# Patient Record
Sex: Male | Born: 1965 | Race: Black or African American | Hispanic: No | Marital: Married | Smoking: Never smoker
Health system: Southern US, Community
[De-identification: ages and names within clinical notes are randomized; demographics above are authoritative.]

## PROBLEM LIST (undated history)

## (undated) DIAGNOSIS — I10 Essential (primary) hypertension: Secondary | ICD-10-CM

## (undated) HISTORY — PX: HERNIA REPAIR: SHX51

---

## 2002-05-30 ENCOUNTER — Emergency Department (HOSPITAL_COMMUNITY): Admission: EM | Admit: 2002-05-30 | Discharge: 2002-05-30 | Payer: Self-pay | Admitting: Emergency Medicine

## 2009-12-26 ENCOUNTER — Emergency Department (HOSPITAL_BASED_OUTPATIENT_CLINIC_OR_DEPARTMENT_OTHER): Admission: EM | Admit: 2009-12-26 | Discharge: 2009-12-26 | Payer: Self-pay | Admitting: Emergency Medicine

## 2011-03-30 LAB — URINALYSIS, ROUTINE W REFLEX MICROSCOPIC
Bilirubin Urine: NEGATIVE
Hgb urine dipstick: NEGATIVE
Specific Gravity, Urine: 1.029 (ref 1.005–1.030)
pH: 6 (ref 5.0–8.0)

## 2011-12-07 ENCOUNTER — Encounter: Payer: Self-pay | Admitting: *Deleted

## 2011-12-07 ENCOUNTER — Emergency Department (HOSPITAL_BASED_OUTPATIENT_CLINIC_OR_DEPARTMENT_OTHER)
Admission: EM | Admit: 2011-12-07 | Discharge: 2011-12-08 | Disposition: A | Discharge status: Home / Self Care | Attending: Emergency Medicine | Admitting: Emergency Medicine

## 2011-12-07 DIAGNOSIS — N390 Urinary tract infection, site not specified: Secondary | ICD-10-CM

## 2011-12-07 DIAGNOSIS — Z79899 Other long term (current) drug therapy: Secondary | ICD-10-CM | POA: Insufficient documentation

## 2011-12-07 HISTORY — DX: Essential (primary) hypertension: I10

## 2011-12-07 NOTE — ED Notes (Signed)
Testicles swollen and painful since yesterday. No problems with urination. Swelling gets better overnight and increases with ambulation.

## 2011-12-08 ENCOUNTER — Other Ambulatory Visit (HOSPITAL_BASED_OUTPATIENT_CLINIC_OR_DEPARTMENT_OTHER): Payer: Self-pay | Admitting: Emergency Medicine

## 2011-12-08 ENCOUNTER — Other Ambulatory Visit (HOSPITAL_BASED_OUTPATIENT_CLINIC_OR_DEPARTMENT_OTHER)

## 2011-12-08 ENCOUNTER — Ambulatory Visit (INDEPENDENT_AMBULATORY_CARE_PROVIDER_SITE_OTHER)
Admission: RE | Admit: 2011-12-08 | Discharge: 2011-12-08 | Disposition: A | Discharge status: Home / Self Care | Source: Ambulatory Visit | Attending: Emergency Medicine | Admitting: Emergency Medicine

## 2011-12-08 ENCOUNTER — Ambulatory Visit (HOSPITAL_BASED_OUTPATIENT_CLINIC_OR_DEPARTMENT_OTHER)
Admission: RE | Admit: 2011-12-08 | Discharge: 2011-12-08 | Disposition: A | Discharge status: Home / Self Care | Source: Ambulatory Visit | Attending: Emergency Medicine | Admitting: Emergency Medicine

## 2011-12-08 DIAGNOSIS — R609 Edema, unspecified: Secondary | ICD-10-CM

## 2011-12-08 DIAGNOSIS — N509 Disorder of male genital organs, unspecified: Secondary | ICD-10-CM

## 2011-12-08 DIAGNOSIS — N433 Hydrocele, unspecified: Secondary | ICD-10-CM | POA: Insufficient documentation

## 2011-12-08 DIAGNOSIS — N5089 Other specified disorders of the male genital organs: Secondary | ICD-10-CM

## 2011-12-08 DIAGNOSIS — N508 Other specified disorders of male genital organs: Secondary | ICD-10-CM | POA: Insufficient documentation

## 2011-12-08 LAB — URINALYSIS, ROUTINE W REFLEX MICROSCOPIC
Glucose, UA: NEGATIVE mg/dL
Specific Gravity, Urine: 1.034 — ABNORMAL HIGH (ref 1.005–1.030)
Urobilinogen, UA: 0.2 mg/dL (ref 0.0–1.0)

## 2011-12-08 LAB — URINE MICROSCOPIC-ADD ON

## 2011-12-08 MED ORDER — CIPROFLOXACIN HCL 500 MG PO TABS
500.0000 mg | ORAL_TABLET | Freq: Once | ORAL | Status: AC
  Administered 2011-12-08: 500 mg via ORAL
  Filled 2011-12-08: qty 1

## 2011-12-08 MED ORDER — CIPROFLOXACIN HCL 500 MG PO TABS: 500.0000 mg | ORAL_TABLET | Freq: Two times a day (BID) | ORAL | Status: AC

## 2011-12-08 MED ORDER — OXYCODONE-ACETAMINOPHEN 5-325 MG PO TABS: 2.0000 | ORAL_TABLET | Freq: Four times a day (QID) | ORAL | Status: AC | PRN
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
## 2011-12-09 LAB — URINE CULTURE
Colony Count: NO GROWTH
Culture  Setup Time: 201212110646

## 2012-08-20 IMAGING — US US SCROTUM
1 series · 14 of 25 positions shown · non-contrast
Comparison: None

CLINICAL DATA: Scrotal pain and swelling.

SCROTAL ULTRASOUND
DOPPLER ULTRASOUND OF THE TESTICLES
TECHNIQUE: Complete ultrasound examination of the testicles,
epididymis, and other scrotal structures was performed.  Color and
spectral Doppler ultrasound were also utilized to evaluate blood
flow to the testicles.

[Series 1: us scrotum · 0.09mm/px · 14 of 39 slices shown]
[im 1/39]
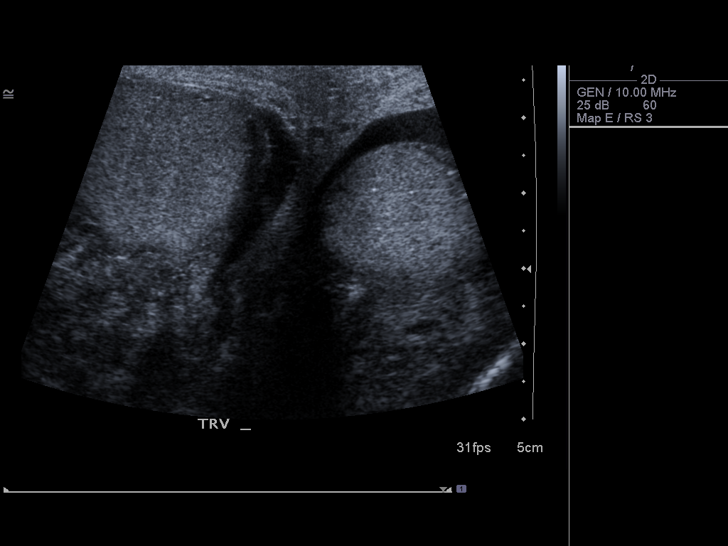
[im 4/39]
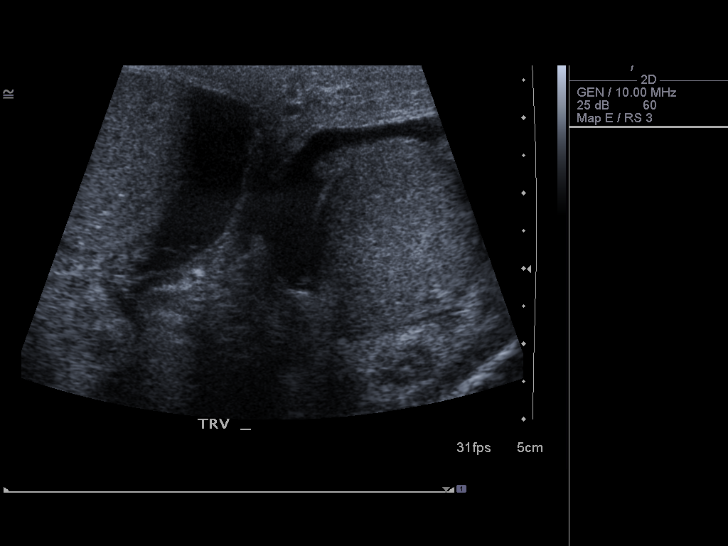
[im 7/39]
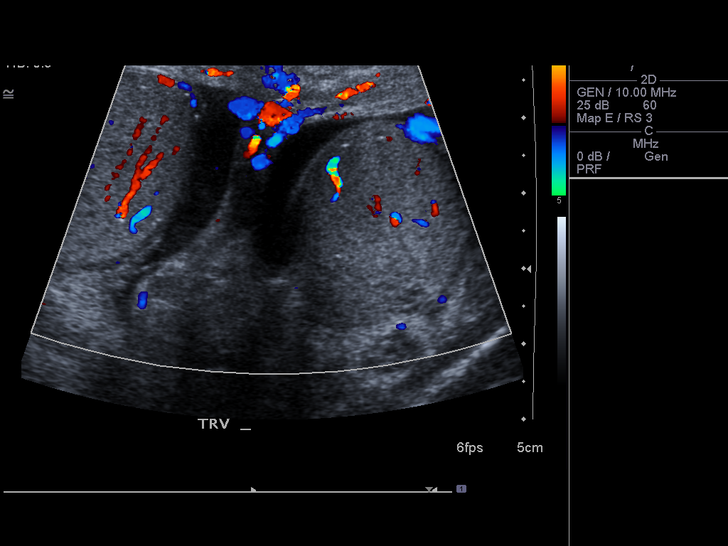
[im 10/39]
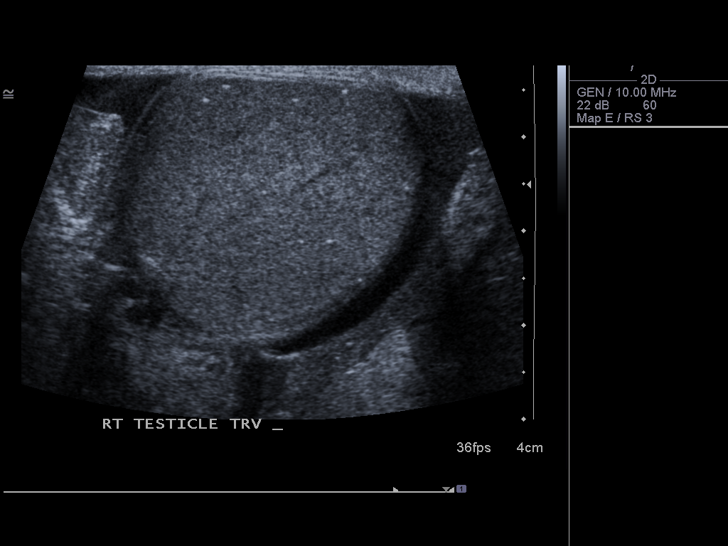
[im 13/39]
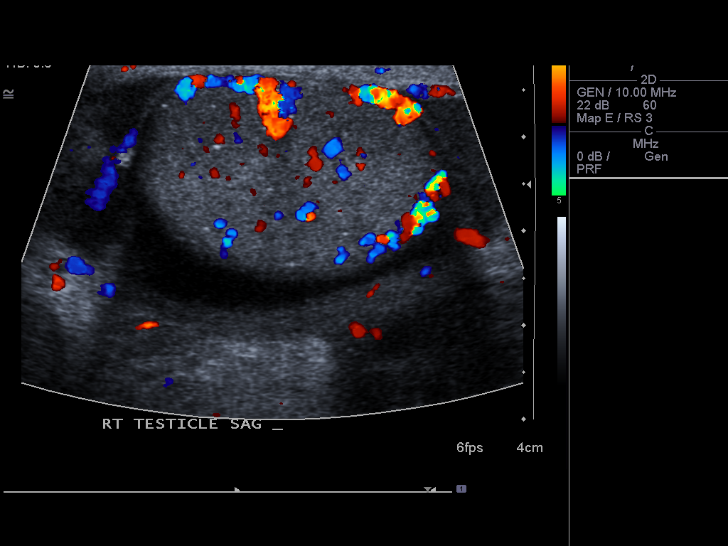
[im 15/39]
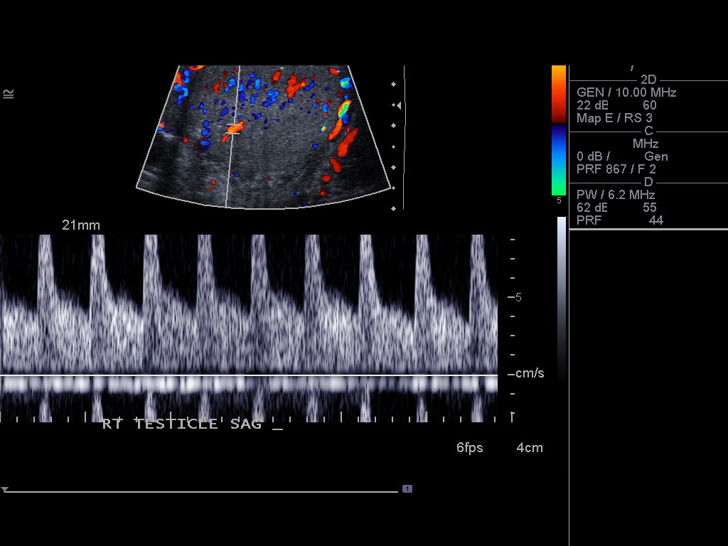
[im 18/39]
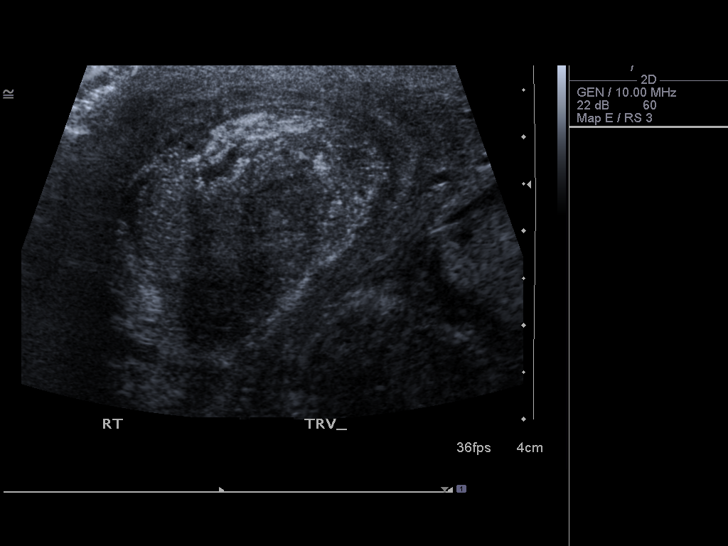
[im 21/39]
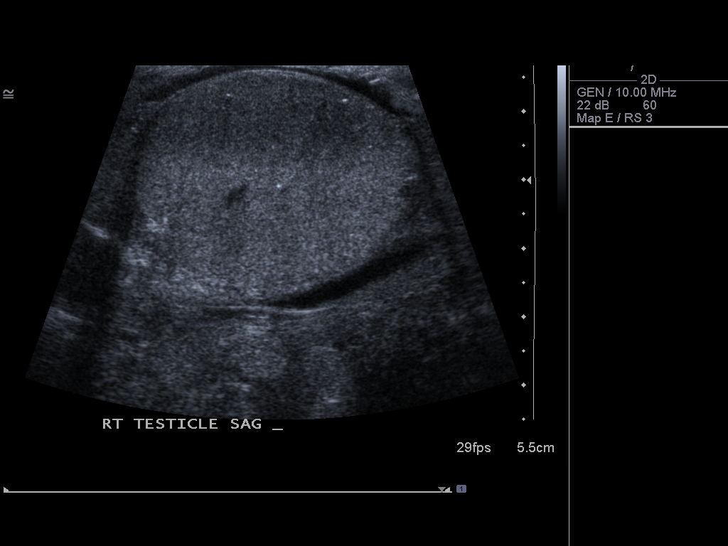
[im 24/39]
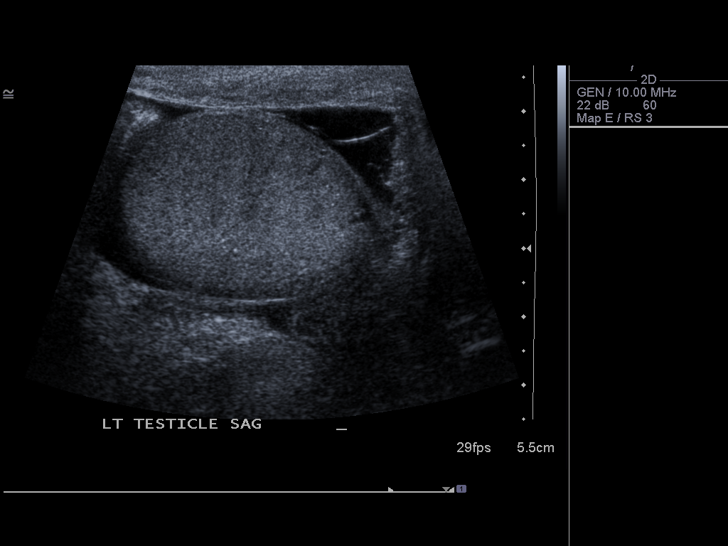
[im 26/39]
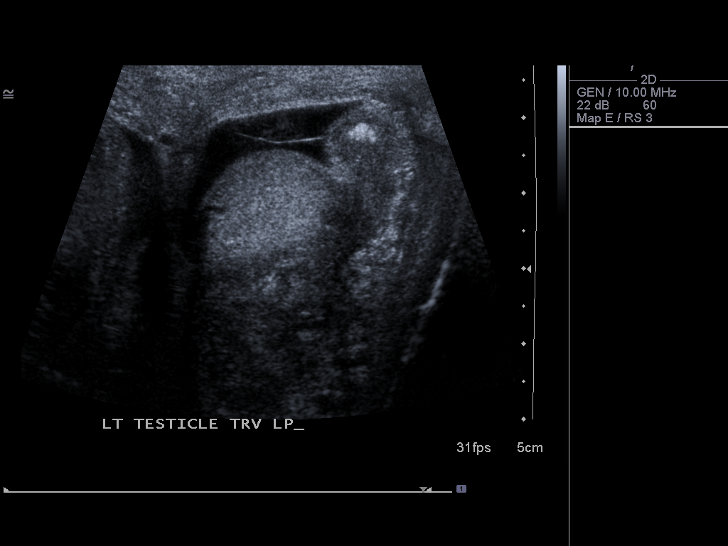
[im 29/39]
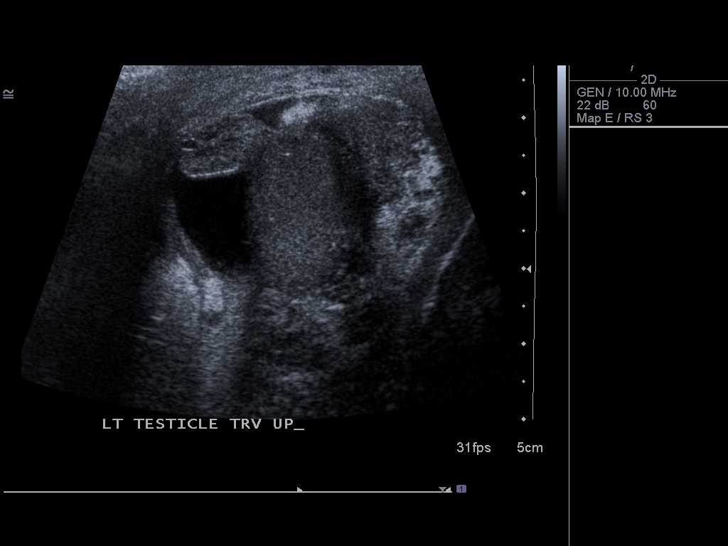
[im 32/39]
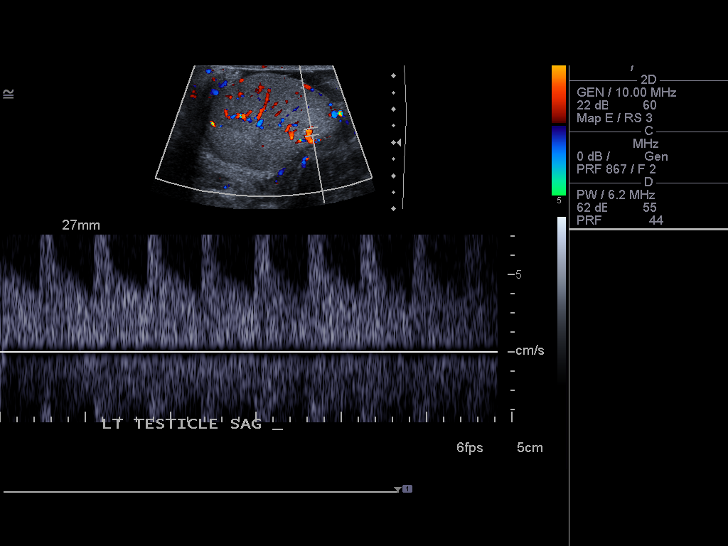
[im 35/39]
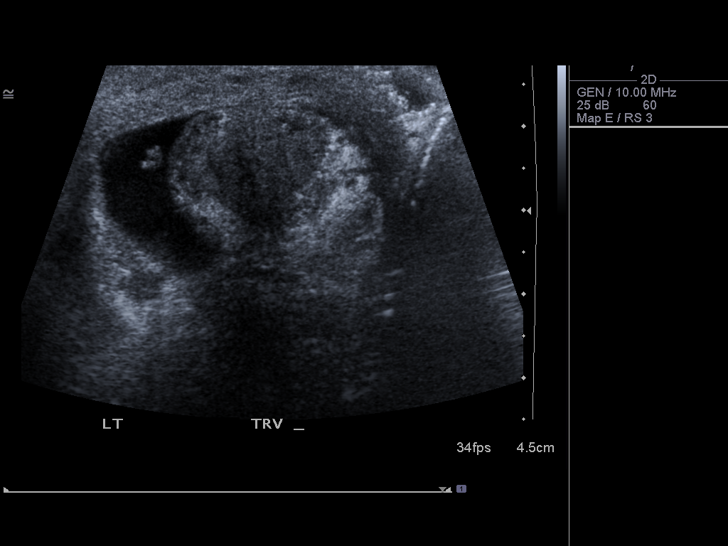
[im 39/39]
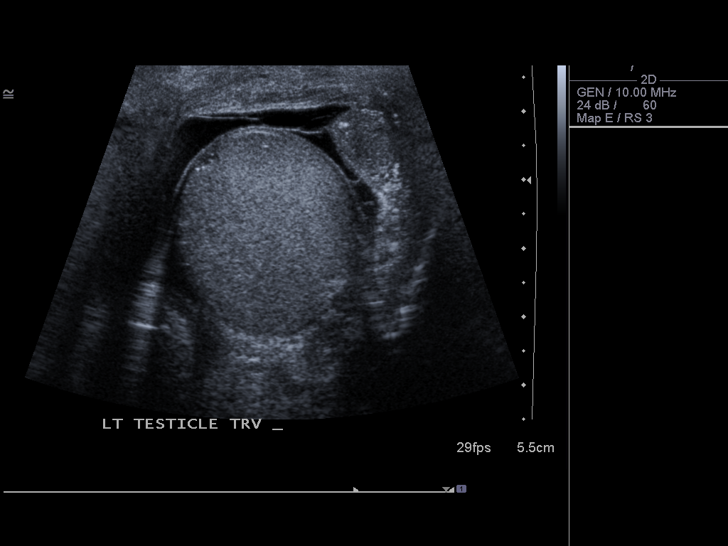

[14 of 25 positions shown; findings below may reference images not displayed]

FINDINGS: Right testis:  Measures 4.2 x 2.9 x 3.1 cm.  Fairly homogeneous
echogenicity except for testicular microlithiasis.  No mass lesion.
Patent intra testicular blood flow with slight hyperemia.

Left testis:  Measures 3.9 x 2.7 x 2.7 cm.  Homogeneous
echogenicity except for testicular microlithiasis.  Patent
intratesticular blood flow.  Slight hyperemia.

Right epididymis:  Markedly enlarged and heterogeneous with
hypervascularity.

Left epididymis:  Markedly enlarged and heterogeneous with
hypervascularity.

Hydocele:  Small bilateral hydroceles.

Varicocele:  None

Pulsed Doppler interrogation of both testes demonstrates low
resistance flow bilaterally.
IMPRESSION: 1.  Findings consistent with epididymo-orchitis bilaterally.
2.  Testicular microlithiasis.  Recommend sonographic surveillance
with repeat study in 1 year.

## 2013-11-24 ENCOUNTER — Emergency Department (HOSPITAL_BASED_OUTPATIENT_CLINIC_OR_DEPARTMENT_OTHER)
Admission: EM | Admit: 2013-11-24 | Discharge: 2013-11-25 | Disposition: A | Discharge status: Skilled Nursing Facility | Attending: Emergency Medicine | Admitting: Emergency Medicine

## 2013-11-24 ENCOUNTER — Encounter (HOSPITAL_BASED_OUTPATIENT_CLINIC_OR_DEPARTMENT_OTHER): Payer: Self-pay | Admitting: Emergency Medicine

## 2013-11-24 DIAGNOSIS — N50812 Left testicular pain: Secondary | ICD-10-CM

## 2013-11-24 DIAGNOSIS — N509 Disorder of male genital organs, unspecified: Secondary | ICD-10-CM | POA: Insufficient documentation

## 2013-11-24 DIAGNOSIS — Z7982 Long term (current) use of aspirin: Secondary | ICD-10-CM | POA: Insufficient documentation

## 2013-11-24 DIAGNOSIS — Z79899 Other long term (current) drug therapy: Secondary | ICD-10-CM | POA: Insufficient documentation

## 2013-11-24 DIAGNOSIS — I10 Essential (primary) hypertension: Secondary | ICD-10-CM | POA: Insufficient documentation

## 2013-11-24 LAB — URINALYSIS, ROUTINE W REFLEX MICROSCOPIC
Glucose, UA: NEGATIVE mg/dL
Ketones, ur: 15 mg/dL — AB
Nitrite: NEGATIVE
Protein, ur: 100 mg/dL — AB
Urobilinogen, UA: 0.2 mg/dL (ref 0.0–1.0)

## 2013-11-24 LAB — URINE MICROSCOPIC-ADD ON

## 2013-11-24 NOTE — ED Notes (Signed)
Called [place] supervisor at 2150 placed called to Dr. Stanton Kidney Copper per request by Dr. Jodelle Green, called for [facility] canceled pt refuses to come with [facility].  Lenita Peregrina NS

## 2013-11-24 NOTE — ED Provider Notes (Addendum)
CSN: [id number]     Arrival date & time 11/24/13  2219 History   First MD Initiated Contact with Patient 11/24/13 2305     Chief Complaint  Patient presents with  . Testicle Pain   (Consider location/radiation/quality/duration/timing/severity/associated sxs/prior Treatment) HPI Comments: Sudden onset L testicular pain since 3pm today. Feels L testicle is positioned higher than normal.   Patient is a 47 y.o. male presenting with testicular pain. The history is provided by the patient.  Testicle Pain This is a new problem. The current episode started 6 to 12 hours ago. The problem occurs constantly. The problem has been gradually worsening. Pertinent negatives include no chest pain, no abdominal pain, no headaches and no shortness of breath. Nothing aggravates the symptoms. Nothing relieves the symptoms. He has tried nothing for the symptoms. The treatment provided no relief.    Past Medical History  Diagnosis Date  . Hypertension    Past Surgical History  Procedure Laterality Date  . Hernia repair     No family history on file. History  Substance Use Topics  . Smoking status: Never Smoker   . Smokeless tobacco: Not on file  . Alcohol Use: Yes    Review of Systems  Constitutional: Negative for fever, activity change, appetite change and fatigue.  HENT: Negative for congestion, facial swelling, rhinorrhea and trouble swallowing.   Eyes: Negative for photophobia and pain.  Respiratory: Negative for cough, chest tightness and shortness of breath.   Cardiovascular: Negative for chest pain and leg swelling.  Gastrointestinal: Negative for nausea, vomiting, abdominal pain, diarrhea and constipation.  Endocrine: Negative for polydipsia and polyuria.  Genitourinary: Positive for testicular pain. Negative for dysuria, urgency, decreased urine volume and difficulty urinating.  Musculoskeletal: Negative for back pain and gait problem.  Skin: Negative for color change, rash and wound.   Allergic/Immunologic: Negative for immunocompromised state.  Neurological: Negative for dizziness, facial asymmetry, speech difficulty, weakness, numbness and headaches.  Psychiatric/Behavioral: Negative for confusion, decreased concentration and agitation.    Allergies  Review of patient's allergies indicates no known allergies.  Home Medications   Current Outpatient Rx  Name  Route  Sig  Dispense  Refill  . UNKNOWN TO PATIENT      States he is on BP med x2 and cholesterol med x 1 but none in the epic list         . aspirin EC 81 MG tablet   Oral   Take 81 mg by mouth daily.           Marland Kitchen atorvastatin (LIPITOR) 40 MG tablet   Oral   Take 40 mg by mouth daily.           . chlorthalidone (HYGROTON) 25 MG tablet   Oral   Take 25 mg by mouth daily.           Marland Kitchen lisinopril (PRINIVIL,ZESTRIL) 40 MG tablet   Oral   Take 40 mg by mouth daily.           Marland Kitchen NIFEdipine (PROCARDIA-XL/ADALAT CC) 60 MG 24 hr tablet   Oral   Take 60 mg by mouth daily.           . Oxycodone-Acetaminophen (PERCOCET PO)   Oral   Take 0.5 tablets by mouth once.            BP 163/105  Pulse 80  Temp(Src) 98.5 F (36.9 C) (Oral)  Resp 20  Ht 5\' 11"  (1.803 m)  Wt 270 lb (122.471 kg)  BMI 37.67 kg/m2  SpO2 100% Physical Exam  Constitutional: He is oriented to person, place, and time. He appears well-developed and well-nourished. No distress.  HENT:  Head: Normocephalic and atraumatic.  Mouth/Throat: No oropharyngeal exudate.  Eyes: Pupils are equal, round, and reactive to light.  Neck: Normal range of motion. Neck supple.  Cardiovascular: Normal rate, regular rhythm and normal heart sounds.  Exam reveals no gallop and no friction rub.   No murmur heard. Pulmonary/Chest: Effort normal and breath sounds normal. No respiratory distress. He has no wheezes. He has no rales.  Abdominal: Soft. Bowel sounds are normal. He exhibits no distension and no mass. There is no tenderness. There is  no rebound and no guarding. Hernia confirmed negative in the right inguinal area and confirmed negative in the left inguinal area.  Genitourinary: Right testis shows no swelling and no tenderness. Cremasteric reflex is not absent on the right side. Left testis shows tenderness. Left testis shows no mass and no swelling. Cremasteric reflex is not absent on the left side. Circumcised.  Musculoskeletal: Normal range of motion. He exhibits no edema and no tenderness.  Neurological: He is alert and oriented to person, place, and time.  Skin: Skin is warm and dry.  Psychiatric: He has a normal mood and affect.    ED Course  Procedures (including critical care time) Labs Review Labs Reviewed  URINALYSIS, ROUTINE W REFLEX MICROSCOPIC - Abnormal; Notable for the following:    Color, Urine AMBER (*)    APPearance TURBID (*)    Hgb urine dipstick LARGE (*)    Ketones, ur 15 (*)    Protein, ur 100 (*)    Leukocytes, UA LARGE (*)    All other components within normal limits  URINE MICROSCOPIC-ADD ON - Abnormal; Notable for the following:    Bacteria, UA MANY (*)    All other components within normal limits  URINE CULTURE   Imaging Review No results found.  EKG Interpretation   None       MDM   1. Testicular pain, left    Pt is a 47 y.o. male with Pmhx as above who presents with sudden onset L testicular pain 9 hrs ago.  No fever, n/v, d/a, dysuria.  Hx of similar several years ago. He states he feels L testicle is situated higher than nml. On PE, VSS, pt in NAD.  +nml lie, no scrotal swelling, nml cremasteric reflex, but L testicle does have higher position than left.  No hernia.  Have spoken to Dr. Excell Seltzer at [facility] hospital who will accept the pt for [place].  Would like to send pt by [facility], but pt refusing. He will go straight to ED by private vehicle.  I have stressed the importance of emergent [place] to r/o torsion as he could have ischemia and loss of function of L testicle.  Ddx includes  testicular torsion, epididymitis, appendiceal torsion.     12:09 AM Pt states he will not go to [facility].  He will sign out AMA.  He understands consequences of testicular torsion, which could lead to loss of testicular function, which could eventually lead to sepsis, death.     Shanna Cisco, MD 11/25/13 0002  Shanna Cisco, MD 11/25/13 0010

## 2013-11-24 NOTE — ED Notes (Signed)
Left testicle pain x today

## 2013-11-25 NOTE — ED Notes (Signed)
Pt refused transport and is refusing to go to [facility] ed for [place] , AMA signed

## 2013-11-25 NOTE — ED Notes (Signed)
MD at bedside. 

## 2013-11-26 LAB — URINE CULTURE: Culture: NO GROWTH

## 2017-03-03 ENCOUNTER — Emergency Department (HOSPITAL_BASED_OUTPATIENT_CLINIC_OR_DEPARTMENT_OTHER)
Admission: EM | Admit: 2017-03-03 | Discharge: 2017-03-03 | Disposition: A | Discharge status: Home / Self Care | Attending: Emergency Medicine | Admitting: Emergency Medicine

## 2017-03-03 ENCOUNTER — Encounter (HOSPITAL_BASED_OUTPATIENT_CLINIC_OR_DEPARTMENT_OTHER): Payer: Self-pay

## 2017-03-03 DIAGNOSIS — Z7982 Long term (current) use of aspirin: Secondary | ICD-10-CM | POA: Diagnosis not present

## 2017-03-03 DIAGNOSIS — I10 Essential (primary) hypertension: Secondary | ICD-10-CM | POA: Diagnosis not present

## 2017-03-03 DIAGNOSIS — J069 Acute upper respiratory infection, unspecified: Secondary | ICD-10-CM | POA: Diagnosis not present

## 2017-03-03 DIAGNOSIS — Z79899 Other long term (current) drug therapy: Secondary | ICD-10-CM | POA: Diagnosis not present

## 2017-03-03 DIAGNOSIS — R05 Cough: Secondary | ICD-10-CM | POA: Diagnosis present

## 2017-03-03 NOTE — ED Notes (Signed)
Pt states has been drinking Gatorade and eating some without difficulty.

## 2017-03-03 NOTE — ED Triage Notes (Signed)
Flu like sx x 3 days-NAD-steady gait

## 2017-03-03 NOTE — ED Provider Notes (Signed)
[facility]   CSN: [id number] Arrival date & time: 03/03/17  1123     History   Chief Complaint Chief Complaint  Patient presents with  . Cough    HPI Darrell Booker is a 51 y.o. male.  Patient is a 51 year old male with history of hypertension. He presents for evaluation of congestion, cough, fever, and chills for the past 3-4 days. He denies any chest pain or difficulty breathing. He denies any sore throat. He believes his wife is coming down with a similar illness. Nonsmoker.   The history is provided by the patient.  URI   This is a new problem. Episode onset: 3-4 days ago. The problem has been gradually worsening. The maximum temperature recorded prior to his arrival was 100 to 100.9 F. Associated symptoms include sore throat and cough. Pertinent negatives include no chest pain, no diarrhea, no nausea and no vomiting. He has tried nothing for the symptoms. The treatment provided no relief.    Past Medical History:  Diagnosis Date  . Hypertension     There are no active problems to display for this patient.   Past Surgical History:  Procedure Laterality Date  . HERNIA REPAIR         Home Medications    Prior to Admission medications   Medication Sig Start Date End Date Taking? Authorizing Provider  AMLODIPINE BESYLATE PO Take by mouth.   Yes Historical Provider, MD  tamsulosin (FLOMAX) 0.4 MG CAPS capsule Take 0.4 mg by mouth.   Yes Historical Provider, MD  aspirin EC 81 MG tablet Take 81 mg by mouth daily.      Historical Provider, MD  atorvastatin (LIPITOR) 40 MG tablet Take 40 mg by mouth daily.      Historical Provider, MD    Family History No family history on file.  Social History Social History  Substance Use Topics  . Smoking status: Never Smoker  . Smokeless tobacco: Never Used  . Alcohol use Yes     Comment: occ     Allergies   Patient has no known allergies.   Review of Systems Review of Systems  HENT:  Positive for sore throat.   Respiratory: Positive for cough.   Cardiovascular: Negative for chest pain.  Gastrointestinal: Negative for diarrhea, nausea and vomiting.  All other systems reviewed and are negative.    Physical Exam Updated Vital Signs BP 146/98 (BP Location: Left Arm)   Pulse 78   Temp 98.8 F (37.1 C) (Oral)   Resp 18   Ht 5\' 11"  (1.803 m)   Wt 267 lb (121.1 kg)   SpO2 100%   BMI 37.24 kg/m   Physical Exam  Constitutional: He is oriented to person, place, and time. He appears well-developed and well-nourished. No distress.  HENT:  Head: Normocephalic and atraumatic.  Mouth/Throat: Oropharynx is clear and moist. No oropharyngeal exudate.  TM's clear bilaterally.  Neck: Normal range of motion. Neck supple.  Cardiovascular: Normal rate and regular rhythm.  Exam reveals no friction rub.   No murmur heard. Pulmonary/Chest: Effort normal and breath sounds normal. No respiratory distress. He has no wheezes. He has no rales.  Abdominal: Soft. Bowel sounds are normal. He exhibits no distension. There is no tenderness.  Musculoskeletal: Normal range of motion. He exhibits no edema.  Lymphadenopathy:    He has no cervical adenopathy.  Neurological: He is alert and oriented to person, place, and time. Coordination normal.  Skin: Skin is warm and dry.  He is not diaphoretic.  Nursing note and vitals reviewed.    ED Treatments / Results  Labs (all labs ordered are listed, but only abnormal results are displayed) Labs Reviewed - No data to display  EKG  EKG Interpretation None       Radiology No results found.  Procedures Procedures (including critical care time)  Medications Ordered in ED Medications - No data to display   Initial Impression / Assessment and Plan / ED Course  I have reviewed the triage vital signs and the nursing notes.  Pertinent labs & imaging results that were available during my care of the patient were reviewed by me and  considered in my medical decision making (see chart for details).  Symptoms most likely viral in nature. Will recommend over-the-counter medications, plenty of fluids, and follow-up as needed.  Final Clinical Impressions(s) / ED Diagnoses   Final diagnoses:  None    New Prescriptions New Prescriptions   No medications on file     Geoffery Lyons, MD 03/03/17 1224

## 2017-03-03 NOTE — ED Notes (Signed)
Presents with not feeling well for the past 2 days, states has had poor appetite, using theraflu, has body aches, feels stiffness, chills, cough. Has intermittent nausea, denies vomiting, has had some diarrhea at times at this time.

## 2017-03-03 NOTE — Discharge Instructions (Signed)
Over-the-counter medications as needed for symptom relief.  Return to the emergency department if you develop chest pain, difficulty breathing, or other new and concerning symptoms.

## 2017-05-12 ENCOUNTER — Telehealth: Payer: Self-pay

## 2017-05-12 NOTE — Telephone Encounter (Signed)
SENT NOTES TO [facility] BY [facility]

## 2017-05-17 ENCOUNTER — Ambulatory Visit
Admission: RE | Admit: 2017-05-17 | Discharge: 2017-05-17 | Disposition: A | Discharge status: Home / Self Care | Source: Ambulatory Visit | Attending: Cardiology | Admitting: Cardiology

## 2017-05-17 ENCOUNTER — Other Ambulatory Visit: Payer: Self-pay | Admitting: Cardiology

## 2017-05-17 DIAGNOSIS — R079 Chest pain, unspecified: Secondary | ICD-10-CM

## 2017-06-09 ENCOUNTER — Telehealth: Payer: Self-pay | Admitting: Cardiology

## 2017-06-09 NOTE — Telephone Encounter (Signed)
[facility] on 06/23/17 with Dr Jens Somrenshaw.  Records put with Dr Ludwig Clarksrenshaw's schedule for 06/23/17. lp

## 2017-06-17 NOTE — Progress Notes (Deleted)
    Referring- Reason for referral-  HPI:   Current Outpatient Prescriptions  Medication Sig Dispense Refill  . AMLODIPINE BESYLATE PO Take by mouth.    Marland Kitchen. aspirin EC 81 MG tablet Take 81 mg by mouth daily.      Marland Kitchen. atorvastatin (LIPITOR) 40 MG tablet Take 40 mg by mouth daily.      . tamsulosin (FLOMAX) 0.4 MG CAPS capsule Take 0.4 mg by mouth.     No current facility-administered medications for this visit.     No Known Allergies  Past Medical History:  Diagnosis Date  . Hypertension     Past Surgical History:  Procedure Laterality Date  . HERNIA REPAIR      Social History   Social History  . Marital status: Married    Spouse name: N/A  . Number of children: N/A  . Years of education: N/A   Occupational History  . Not on file.   Social History Main Topics  . Smoking status: Never Smoker  . Smokeless tobacco: Never Used  . Alcohol use Yes     Comment: occ  . Drug use: No  . Sexual activity: Not on file   Other Topics Concern  . Not on file   Social History Narrative  . No narrative on file    No family history on file.  ROS: no fevers or chills, productive cough, hemoptysis, dysphasia, odynophagia, melena, hematochezia, dysuria, hematuria, rash, seizure activity, orthopnea, PND, pedal edema, claudication. Remaining systems are negative.  Physical Exam:   There were no vitals taken for this visit.  General:  Well developed/well nourished in NAD Skin warm/dry Patient not depressed No peripheral clubbing Back-normal HEENT-normal/normal eyelids Neck supple/normal carotid upstroke bilaterally; no bruits; no JVD; no thyromegaly chest - CTA/ normal expansion CV - RRR/normal S1 and S2; no murmurs, rubs or gallops;  PMI nondisplaced Abdomen -NT/ND, no HSM, no mass, + bowel sounds, no bruit 2+ femoral pulses, no bruits Ext-no edema, chords, 2+ DP Neuro-grossly nonfocal  ECG - personally reviewed  A/P  1  Olga MillersBrian Crenshaw, MD

## 2017-06-23 ENCOUNTER — Ambulatory Visit: Admitting: Cardiology

## 2018-01-28 IMAGING — DX DG CHEST 2V
2 series · 2 of 2 positions shown · non-contrast
Comparison: None.

CLINICAL DATA: Chest pain for several months.

EXAM:
CHEST  2 VIEW

[dg chest 2 view (1 of 2)]
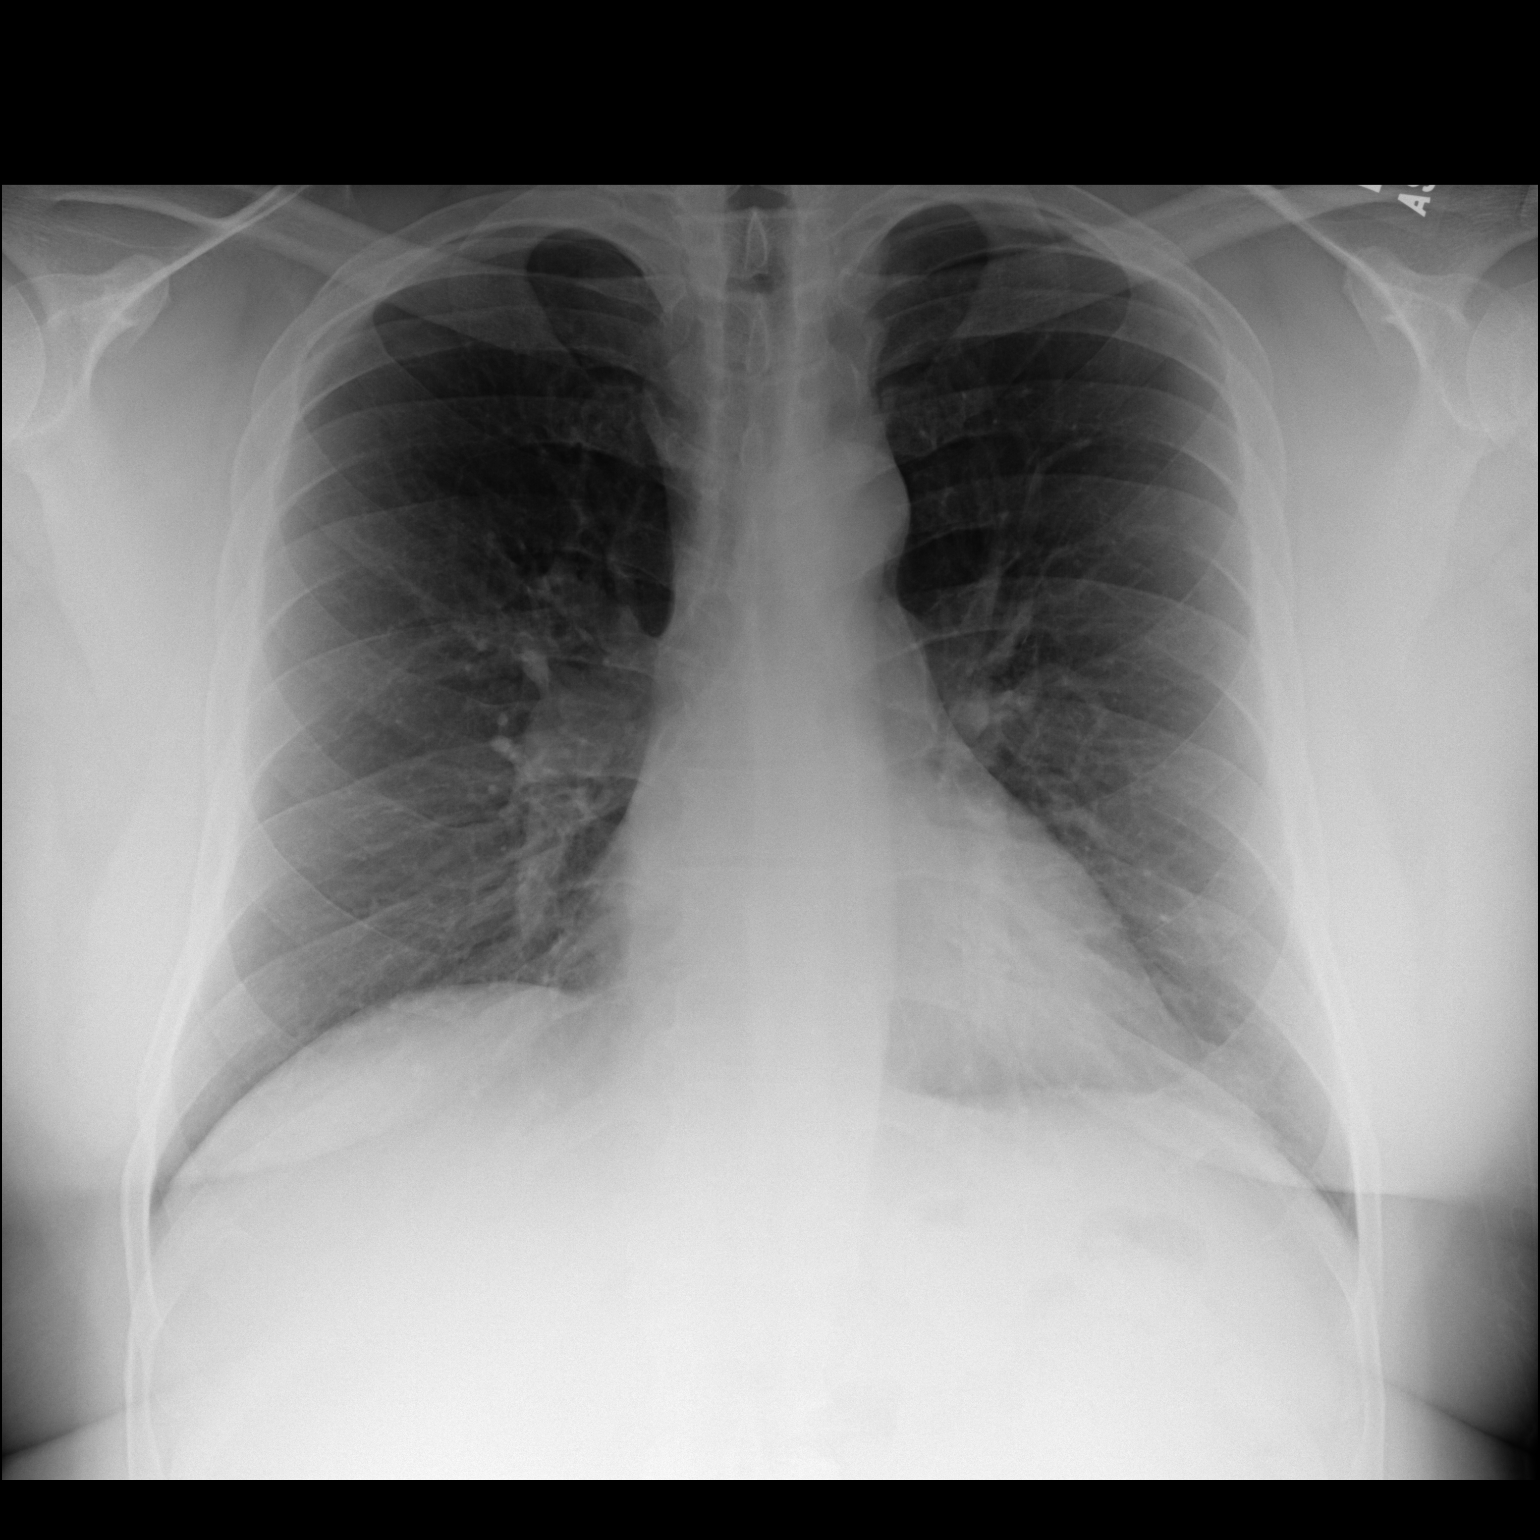

[dg chest 2 view (2 of 2)]
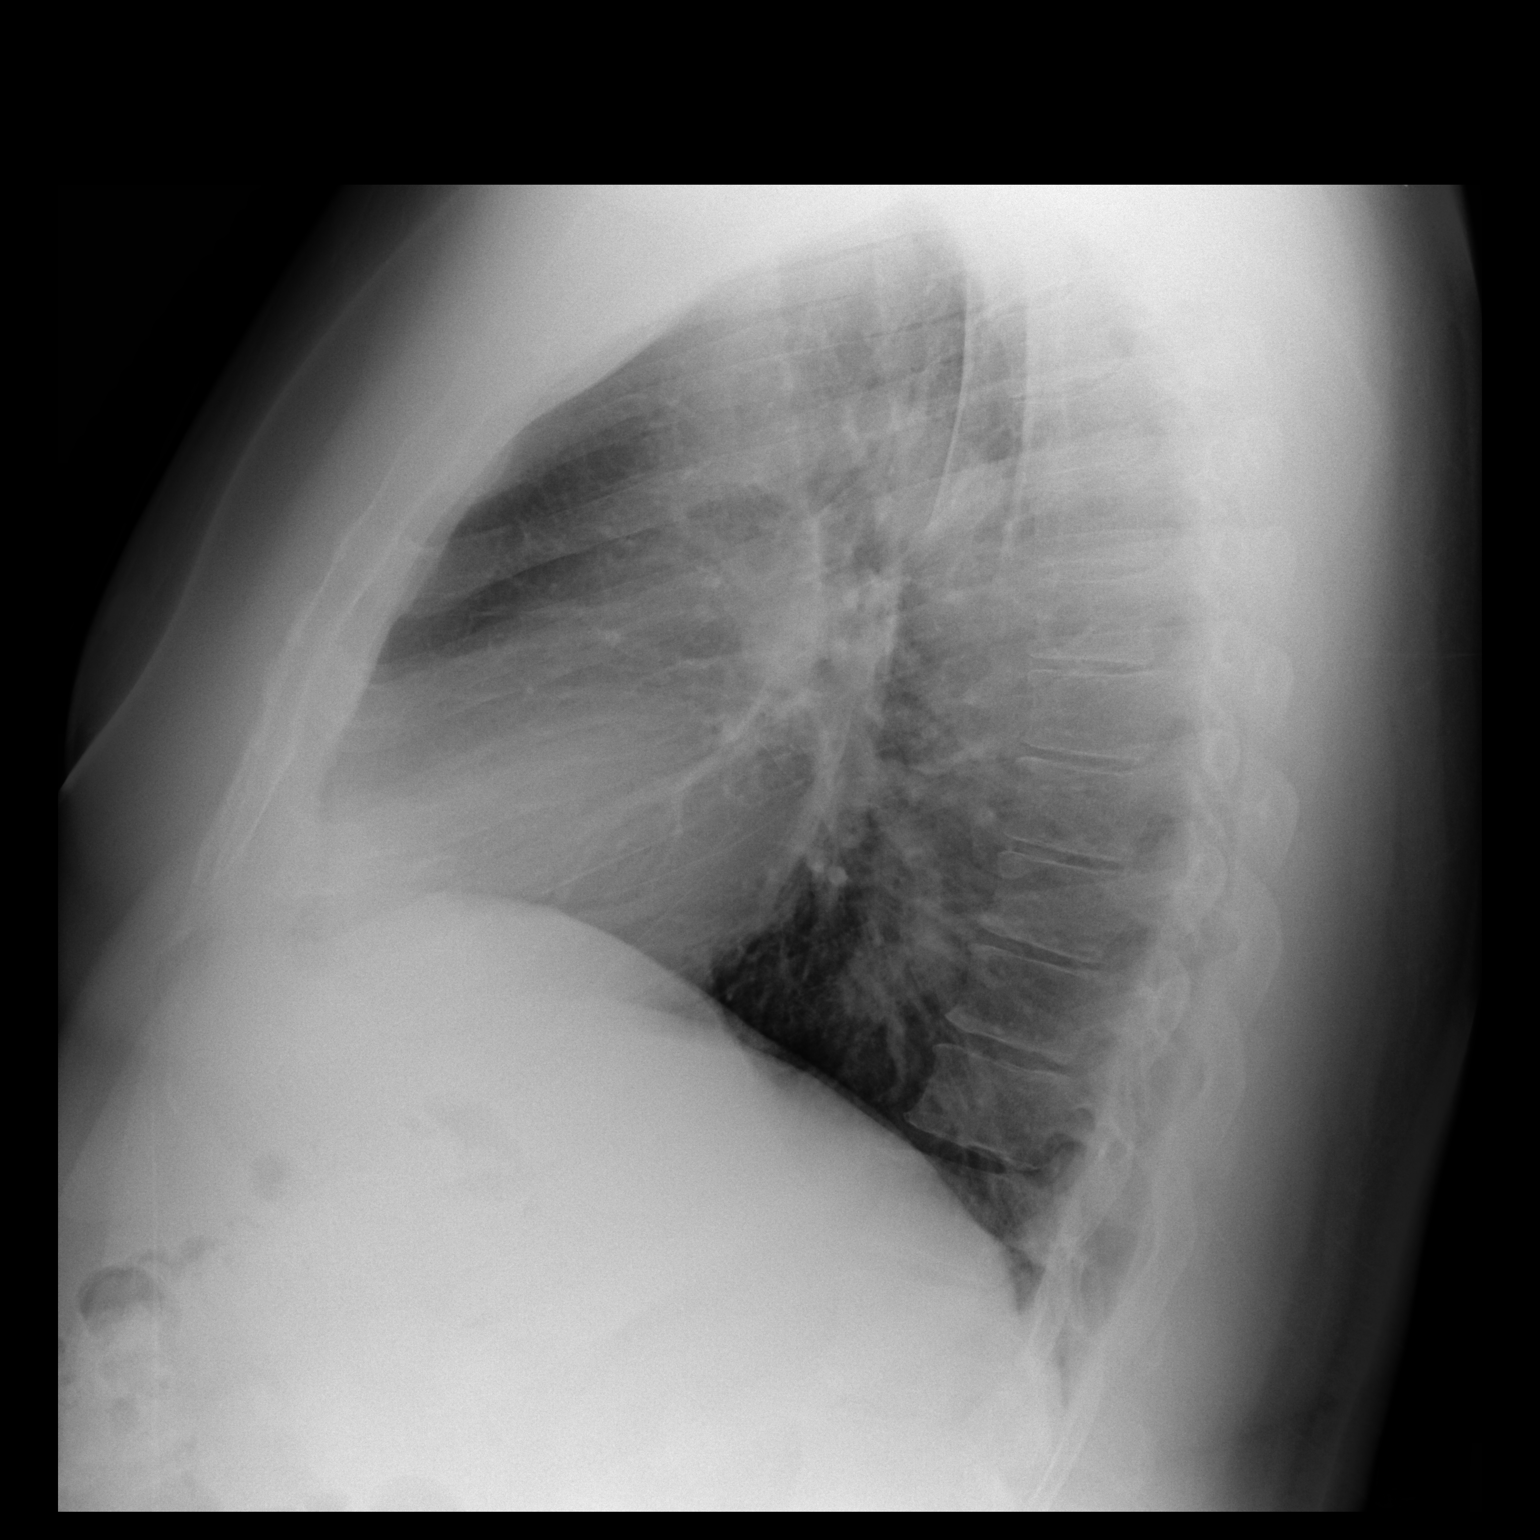

[2 of 2 positions shown; findings below may reference images not displayed]

FINDINGS: The heart size and mediastinal contours are within normal limits.
Both lungs are clear. No pneumothorax or pleural effusion is noted.
The visualized skeletal structures are unremarkable.
IMPRESSION: No active cardiopulmonary disease.
